# Patient Record
Sex: Male | Born: 1980 | Race: White | Hispanic: No | Marital: Single
Health system: Southern US, Community
[De-identification: ages and names within clinical notes are randomized; demographics above are authoritative.]

---

## 2007-09-21 ENCOUNTER — Emergency Department (HOSPITAL_COMMUNITY): Admission: EM | Admit: 2007-09-21 | Discharge: 2007-09-22 | Payer: Self-pay | Admitting: Emergency Medicine

## 2009-03-29 ENCOUNTER — Ambulatory Visit: Payer: Self-pay | Admitting: Diagnostic Radiology

## 2009-03-29 ENCOUNTER — Emergency Department (HOSPITAL_BASED_OUTPATIENT_CLINIC_OR_DEPARTMENT_OTHER): Admission: EM | Admit: 2009-03-29 | Discharge: 2009-03-29 | Payer: Self-pay | Admitting: Emergency Medicine

## 2009-06-07 ENCOUNTER — Emergency Department (HOSPITAL_COMMUNITY): Admission: EM | Admit: 2009-06-07 | Discharge: 2009-06-07 | Payer: Self-pay | Admitting: Emergency Medicine

## 2010-02-05 ENCOUNTER — Emergency Department (HOSPITAL_COMMUNITY): Admission: EM | Admit: 2010-02-05 | Discharge: 2010-02-05 | Payer: Self-pay | Admitting: Emergency Medicine

## 2021-08-03 ENCOUNTER — Encounter (HOSPITAL_BASED_OUTPATIENT_CLINIC_OR_DEPARTMENT_OTHER): Payer: Medicaid Other | Attending: Internal Medicine | Admitting: Internal Medicine

## 2021-08-03 ENCOUNTER — Other Ambulatory Visit: Payer: Self-pay

## 2021-08-03 DIAGNOSIS — I10 Essential (primary) hypertension: Secondary | ICD-10-CM | POA: Insufficient documentation

## 2021-08-03 DIAGNOSIS — L97512 Non-pressure chronic ulcer of other part of right foot with fat layer exposed: Secondary | ICD-10-CM | POA: Insufficient documentation

## 2021-08-03 DIAGNOSIS — F1910 Other psychoactive substance abuse, uncomplicated: Secondary | ICD-10-CM | POA: Insufficient documentation

## 2021-08-03 DIAGNOSIS — L03115 Cellulitis of right lower limb: Secondary | ICD-10-CM | POA: Insufficient documentation
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
## 2021-08-03 NOTE — Progress Notes (Signed)
BEE, HAMMERSCHMIDT ([id number]) Visit Report for 08/03/2021 Abuse/Suicide Risk Screen Details Patient Name: Date of Service: [name], [name] 08/03/2021 7:30 A M Medical Record Number: [id number] Patient Account Number: [id number] Date of Birth/Sex: Treating RN: 19-Sep-1981 (40 y.o. Lytle Michaels Primary Care Natale Barba: Other Clinician: Referring Emila Steinhauser: Treating Jaena Brocato/Extender: Tilda Franco in Treatment: 0 Abuse/Suicide Risk Screen Items Answer ABUSE RISK SCREEN: Has anyone close to you tried to hurt or harm you recentlyo No Do you feel uncomfortable with anyone in your familyo No Has anyone forced you do things that you didnt want to doo No Electronic Signature(s) Signed: 08/03/2021 4:35:28 PM By: Antonieta Iba Entered By: Antonieta Iba on 08/03/2021 08:03:31 -------------------------------------------------------------------------------- Activities of Daily Living Details Patient Name: Date of Service: [name], [name] 08/03/2021 7:30 A M Medical Record Number: [id number] Patient Account Number: [id number] Date of Birth/Sex: Treating RN: 1981-03-31 (40 y.o. Lytle Michaels Primary Care Krysta Bloomfield: Other Clinician: Referring Aleesia Henney: Treating Cletis Muma/Extender: Tilda Franco in Treatment: 0 Activities of Daily Living Items Answer Activities of Daily Living (Please select one for each item) Drive Automobile Completely Able T Medications ake Completely Able Use T elephone Completely Able Care for Appearance Completely Able Use T oilet Completely Able Bath / Shower Completely Able Dress Self Completely Able Feed Self Completely Able Walk Completely Able Get In / Out Bed Completely Able Housework Completely Able Prepare Meals Completely Able Handle Money Completely Able Shop for Self Completely Able Electronic Signature(s) Signed: 08/03/2021 4:35:28 PM By: Antonieta Iba Entered By: Antonieta Iba on 08/03/2021  07:53:24 -------------------------------------------------------------------------------- Education Screening Details Patient Name: Date of Service: [name] 08/03/2021 7:30 A M Medical Record Number: [id number] Patient Account Number: [id number] Date of Birth/Sex: Treating RN: 07-21-1981 (40 y.o. Lytle Michaels Primary Care Aristeo Hankerson: Other Clinician: Referring Nakyra Bourn: Treating Alania Overholt/Extender: Tilda Franco in Treatment: 0 Primary Learner Assessed: Patient Learning Preferences/Education Level/Primary Language Learning Preference: Explanation, Demonstration, Printed Material Highest Education Level: High School Preferred Language: English Cognitive Barrier Language Barrier: No Translator Needed: No Memory Deficit: No Emotional Barrier: No Cultural/Religious Beliefs Affecting Medical Care: No Physical Barrier Impaired Vision: Yes Glasses Impaired Hearing: No Decreased Hand dexterity: No Knowledge/Comprehension Knowledge Level: Medium Comprehension Level: High Ability to understand written instructions: High Ability to understand verbal instructions: High Motivation Anxiety Level: Calm Cooperation: Cooperative Education Importance: Acknowledges Need Interest in Health Problems: Asks Questions Perception: Coherent Willingness to Engage in Self-Management High Activities: Readiness to Engage in Self-Management High Activities: Electronic Signature(s) Signed: 08/03/2021 4:35:28 PM By: Antonieta Iba Entered By: Antonieta Iba on 08/03/2021 08:04:04 -------------------------------------------------------------------------------- Fall Risk Assessment Details Patient Name: Date of Service: [name] 08/03/2021 7:30 A M Medical Record Number: [id number] Patient Account Number: [id number] Date of Birth/Sex: Treating RN: October 31, 1980 (40 y.o. Lytle Michaels Primary Care Ayeden Gladman: Other Clinician: Referring Saben Donigan: Treating  Matilyn Fehrman/Extender: Tilda Franco in Treatment: 0 Fall Risk Assessment Items Have you had 2 or more falls in the last 12 monthso 0 No Have you had any fall that resulted in injury in the last 12 monthso 0 No FALLS RISK SCREEN History of falling - immediate or within 3 months 0 No Secondary diagnosis (Do you have 2 or more medical diagnoseso) 0 No Ambulatory aid None/bed rest/wheelchair/nurse 0 Yes Crutches/cane/walker 0 No Furniture 0 No Intravenous therapy Access/Saline/Heparin Lock 0 No Gait/Transferring Normal/ bed rest/ wheelchair 0 Yes Weak (short steps with or without shuffle, stooped but able to lift head while walking, may seek 0 No support from furniture) Impaired (short steps  with shuffle, may have difficulty arising from chair, head down, impaired 0 No balance) Mental Status Oriented to own ability 0 Yes Electronic Signature(s) Signed: 08/03/2021 4:35:28 PM By: Antonieta Iba Entered By: Antonieta Iba on 08/03/2021 07:53:41 -------------------------------------------------------------------------------- Foot Assessment Details Patient Name: Date of Service: [name] 08/03/2021 7:30 A M Medical Record Number: [id number] Patient Account Number: [id number] Date of Birth/Sex: Treating RN: 1981/03/29 (40 y.o. Lytle Michaels Primary Care Amerigo Mcglory: Other Clinician: Referring Lucrezia Dehne: Treating Kandas Oliveto/Extender: Tilda Franco in Treatment: 0 Foot Assessment Items Site Locations + = Sensation present, - = Sensation absent, C = Callus, U = Ulcer R = Redness, W = Warmth, M = Maceration, PU = Pre-ulcerative lesion F = Fissure, S = Swelling, D = Dryness Assessment Right: Left: Other Deformity: No No Prior Foot Ulcer: No No Prior Amputation: No No Charcot Joint: No No Ambulatory Status: Ambulatory Without Help Gait: Steady Electronic Signature(s) Signed: 08/03/2021 4:35:28 PM By: Antonieta Iba Entered By: Antonieta Iba on 08/03/2021  08:07:06 -------------------------------------------------------------------------------- Nutrition Risk Screening Details Patient Name: Date of Service: [name], [name] 08/03/2021 7:30 A M Medical Record Number: [id number] Patient Account Number: [id number] Date of Birth/Sex: Treating RN: May 14, 1981 (40 y.o. Lytle Michaels Primary Care Koven Belinsky: Other Clinician: Referring Soleil Mas: Treating Janye Maynor/Extender: Tilda Franco in Treatment: 0 Height (in): Weight (lbs): Body Mass Index (BMI): Nutrition Risk Screening Items Score Screening NUTRITION RISK SCREEN: I have an illness or condition that made me change the kind and/or amount of food I eat 0 No I eat fewer than two meals per day 0 No I eat few fruits and vegetables, or milk products 0 No I have three or more drinks of beer, liquor or wine almost every day 0 No I have tooth or mouth problems that make it hard for me to eat 0 No I don't always have enough money to buy the food I need 0 No I eat alone most of the time 0 No I take three or more different prescribed or over-the-counter drugs a day 0 No Without wanting to, I have lost or gained 10 pounds in the last six months 0 No I am not always physically able to shop, cook and/or feed myself 0 No Nutrition Protocols Good Risk Protocol 0 No interventions needed Moderate Risk Protocol High Risk Proctocol Risk Level: Good Risk Score: 0 Electronic Signature(s) Signed: 08/03/2021 4:35:28 PM By: Antonieta Iba Entered By: Antonieta Iba on 08/03/2021 07:53:49
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
## 2021-08-10 ENCOUNTER — Other Ambulatory Visit: Payer: Self-pay

## 2021-08-10 ENCOUNTER — Encounter (HOSPITAL_BASED_OUTPATIENT_CLINIC_OR_DEPARTMENT_OTHER): Payer: Medicaid Other | Admitting: Internal Medicine

## 2021-08-10 DIAGNOSIS — L97512 Non-pressure chronic ulcer of other part of right foot with fat layer exposed: Secondary | ICD-10-CM

## 2021-08-10 DIAGNOSIS — F1910 Other psychoactive substance abuse, uncomplicated: Secondary | ICD-10-CM

## 2021-08-10 DIAGNOSIS — L03115 Cellulitis of right lower limb: Secondary | ICD-10-CM
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
## 2021-08-17 ENCOUNTER — Other Ambulatory Visit: Payer: Self-pay

## 2021-08-17 ENCOUNTER — Encounter (HOSPITAL_BASED_OUTPATIENT_CLINIC_OR_DEPARTMENT_OTHER): Payer: Medicaid Other | Admitting: Internal Medicine

## 2021-08-17 DIAGNOSIS — L97512 Non-pressure chronic ulcer of other part of right foot with fat layer exposed: Secondary | ICD-10-CM

## 2021-08-17 NOTE — Progress Notes (Signed)
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, uncomplicated Facility Procedures CPT4 Code: [id number] Description: 11042 - DEB SUBQ TISSUE 20 SQ CM/< ICD-10 Diagnosis Description L97.512 Non-pressure chronic ulcer of other  part of right foot with fat layer exposed Modifier: Quantity: 1 Physician Procedures : CPT4 Code Description Modifier [id number] 11042 - WC PHYS SUBQ TISS 20 SQ CM ICD-10 Diagnosis Description  L97.512 Non-pressure chronic ulcer of other part of right foot with fat layer exposed Quantity: 1 Electronic Signature(s) Signed: 08/17/2021 1:24:03 PM By: Geralyn Corwin DO Signed: 08/17/2021 6:26:42 PM By: Shawn Stall RN, BSN Entered By: Shawn Stall on 08/17/2021 08:45:59
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
## 2021-08-19 ENCOUNTER — Other Ambulatory Visit: Payer: Self-pay | Admitting: Internal Medicine

## 2021-08-19 DIAGNOSIS — L539 Erythematous condition, unspecified: Secondary | ICD-10-CM

## 2021-08-19 DIAGNOSIS — R609 Edema, unspecified: Secondary | ICD-10-CM

## 2021-08-20 LAB — AEROBIC CULTURE W GRAM STAIN (SUPERFICIAL SPECIMEN): Gram Stain: NONE SEEN

## 2021-08-23 LAB — ANAEROBIC CULTURE W GRAM STAIN: Gram Stain: NONE SEEN

## 2021-08-24 ENCOUNTER — Encounter (HOSPITAL_BASED_OUTPATIENT_CLINIC_OR_DEPARTMENT_OTHER): Payer: Self-pay | Attending: Internal Medicine | Admitting: Internal Medicine

## 2021-08-24 ENCOUNTER — Other Ambulatory Visit: Payer: Self-pay

## 2021-08-24 ENCOUNTER — Ambulatory Visit
Admission: RE | Admit: 2021-08-24 | Discharge: 2021-08-24 | Disposition: A | Discharge status: Home / Self Care | Payer: Worker's Compensation | Source: Ambulatory Visit | Attending: Internal Medicine | Admitting: Internal Medicine

## 2021-08-24 DIAGNOSIS — L03115 Cellulitis of right lower limb: Secondary | ICD-10-CM | POA: Insufficient documentation

## 2021-08-24 DIAGNOSIS — L539 Erythematous condition, unspecified: Secondary | ICD-10-CM

## 2021-08-24 DIAGNOSIS — Z833 Family history of diabetes mellitus: Secondary | ICD-10-CM | POA: Insufficient documentation

## 2021-08-24 DIAGNOSIS — R609 Edema, unspecified: Secondary | ICD-10-CM

## 2021-08-24 DIAGNOSIS — L97512 Non-pressure chronic ulcer of other part of right foot with fat layer exposed: Secondary | ICD-10-CM | POA: Insufficient documentation

## 2021-08-25 NOTE — Progress Notes (Signed)
OSEAS, DETTY ([id number]) Visit Report for 08/24/2021 SuperBill Details Patient Name: Date of Service: MACARI, ZALESKY 08/24/2021 Medical Record Number: [id number] Patient Account Number: [id number] Date of Birth/Sex: Treating RN: 03/27/1981 (40 y.o. 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

## 2021-08-25 NOTE — Progress Notes (Signed)
EDRIK, RUNDLE ([id number]) Visit Report for 08/24/2021 Arrival Information Details Patient Name: Date of Service: [name], [name] 08/24/2021 8:00 A M Medical Record Number: [id number] Patient Account Number: [id number] Date of Birth/Sex: Treating RN: [birth year] (40 y.o. Tammy Sours Primary Care Miriana Gaertner: Other Clinician: Referring Anikka Marsan: Treating Madina Galati/Extender: Tilda Franco in Treatment: 3 Visit Information History Since Last Visit All ordered tests and consults were completed: Yes Patient Arrived: Ambulatory Added or deleted any medications: No Arrival Time: 08:09 Any new allergies or adverse reactions: No Accompanied By: self Had a fall or experienced change in No Transfer Assistance: None activities of daily living that may affect Patient Identification Verified: Yes risk of falls: Secondary Verification Process Completed: Yes Signs or symptoms of abuse/neglect since last visito No Patient Requires Transmission-Based Precautions: No Hospitalized since last visit: No Patient Has Alerts: No Implantable device outside of the clinic excluding No cellular tissue based products placed in the center since last visit: Has Dressing in Place as Prescribed: Yes Pain Present Now: Yes Notes Per patient ultrasound scheduled for later today. Explained the ultrasound results should be in tomorrow for [name] to evaluate. Patient in agreement. Electronic Signature(s) Signed: 08/24/2021 2:38:54 PM By: Shawn Stall RN, BSN Entered By: Shawn Stall on 08/24/2021 08:16:58 -------------------------------------------------------------------------------- Clinic Level of Care Assessment Details Patient Name: Date of Service: [name], [name] 08/24/2021 8:00 A M Medical Record Number: [id number] Patient Account Number: [id number] Date of Birth/Sex: Treating RN: May 31, 1981 (40 y.o. Tammy Sours Primary Care Oral Remache: Other Clinician: Referring Elleen Coulibaly: Treating  Yarexi Pawlicki/Extender: Tilda Franco in Treatment: 3 Clinic Level of Care Assessment Items TOOL 4 Quantity Score X- 1 0 Use when only an EandM is performed on FOLLOW-UP visit ASSESSMENTS - Nursing Assessment / Reassessment X- 1 10 Reassessment of Co-morbidities (includes updates in patient status) X- 1 5 Reassessment of Adherence to Treatment Plan ASSESSMENTS - Wound and Skin A ssessment / Reassessment []  - 0 Simple Wound Assessment / Reassessment - one wound X- 2 5 Complex Wound Assessment / Reassessment - multiple wounds X- 1 10 Dermatologic / Skin Assessment (not related to wound area) ASSESSMENTS - Focused Assessment []  - 0 Circumferential Edema Measurements - multi extremities []  - 0 Nutritional Assessment / Counseling / Intervention []  - 0 Lower Extremity Assessment (monofilament, tuning fork, pulses) []  - 0 Peripheral Arterial Disease Assessment (using hand held doppler) ASSESSMENTS - Ostomy and/or Continence Assessment and Care []  - 0 Incontinence Assessment and Management []  - 0 Ostomy Care Assessment and Management (repouching, etc.) PROCESS - Coordination of Care []  - 0 Simple Patient / Family Education for ongoing care X- 1 20 Complex (extensive) Patient / Family Education for ongoing care X- 1 10 Staff obtains , Records, T Results / Process Orders est []  - 0 Staff telephones HHA, Nursing Homes / Clarify orders / etc []  - 0 Routine Transfer to another Facility (non-emergent condition) []  - 0 Routine Hospital Admission (non-emergent condition) []  - 0 New Admissions / / Ordering NPWT Apligraf, etc. , []  - 0 Emergency Hospital Admission (emergent condition) []  - 0 Simple Discharge Coordination X- 1 15 Complex (extensive) Discharge Coordination PROCESS - Special Needs []  - 0 Pediatric / Minor Patient Management []  - 0 Isolation Patient Management []  - 0 Hearing / Language / Visual special needs []  -  0 Assessment of Community assistance (transportation, D/C planning, etc.) []  - 0 Additional assistance / Altered mentation []  - 0 Support Surface(s) Assessment (bed, cushion, seat, etc.) INTERVENTIONS - Wound Cleansing /  Measurement []  - 0 Simple Wound Cleansing - one wound X- 2 5 Complex Wound Cleansing - multiple wounds X- 1 5 Wound Imaging (photographs - any number of wounds) []  - 0 Wound Tracing (instead of photographs) []  - 0 Simple Wound Measurement - one wound X- 2 5 Complex Wound Measurement - multiple wounds INTERVENTIONS - Wound Dressings X - Small Wound Dressing one or multiple wounds 2 10 []  - 0 Medium Wound Dressing one or multiple wounds []  - 0 Large Wound Dressing one or multiple wounds []  - 0 Application of Medications - topical []  - 0 Application of Medications - injection INTERVENTIONS - Miscellaneous []  - 0 External ear exam []  - 0 Specimen Collection (cultures, biopsies, blood, body fluids, etc.) []  - 0 Specimen(s) / Culture(s) sent or taken to Lab for analysis []  - 0 Patient Transfer (multiple staff / / Similar devices) []  - 0 Simple Staple / Suture removal (25 or less) []  - 0 Complex Staple / Suture removal (26 or more) []  - 0 Hypo / Hyperglycemic Management (close monitor of Blood Glucose) []  - 0 Ankle / Brachial Index (ABI) - do not check if billed separately X- 1 5 Vital Signs Has the patient been seen at the hospital within the last three years: Yes Total Score: 130 Level Of Care: New/Established - Level 4 Electronic Signature(s) Signed: 08/24/2021 2:38:54 PM By: RN, BSN Entered By: on 08/24/2021 08:17:54 -------------------------------------------------------------------------------- Encounter Discharge Information Details Patient Name: Date of Service: 08/24/2021 8:00 A M Medical Record Number: Patient Account Number: Date of Birth/Sex: Treating  RN: 06/04/1981 (40 y.o. Nurse, adult Primary Care Olvin Rohr: Other Clinician: Referring Shadonna Benedick: Treating Meiko Ives/Extender: in Treatment: 3 Encounter Discharge Information Items Discharge Condition: Stable Ambulatory Status: Ambulatory Discharge Destination: Home Transportation: Private Auto Accompanied By: self Schedule Follow-up Appointment: Yes Clinical Summary of Care: Patient Declined Electronic Signature(s) Signed: 08/25/2021 5:02:41 PM By: RN Entered By: on 08/24/2021 08:27:22 -------------------------------------------------------------------------------- Patient/Caregiver Education Details Patient Name: Date of Service: [name], [name] 12/5/2022andnbsp8:00 A M Medical Record Number: Shawn Stall Patient Account Number: 14/01/2021 Date of Birth/Gender: Treating RN: December 01, 1980 (40 y.o. [id number] Primary Care Physician: Other Clinician: Referring Physician: Treating Physician/Extender: [id number] in Treatment: 3 Education Assessment Education Provided To: Patient Education Topics Provided Pain: Handouts: A Guide to Pain Control Methods: Explain/Verbal Responses: Reinforcements needed Electronic Signature(s) Signed: 08/25/2021 5:02:41 PM By: 24 RN Entered By: Lytle Michaels on 08/24/2021 08:26:42 -------------------------------------------------------------------------------- Wound Assessment Details Patient Name: Date of Service: 14/02/2021 08/24/2021 8:00 A M Medical Record Number: Karie Schwalbe Patient Account Number: 14/01/2021 Date of Birth/Sex: Treating RN: 1981/04/30 (40 y.o. [id number] Primary Care Gahel Safley: Other Clinician: Referring Jayanna Kroeger: Treating Damaris Abeln/Extender: 02/13/1981 in Treatment: 3 Wound Status Wound Number: 1 Primary Etiology: Infection - not elsewhere classified Wound Location: Right T Great oe Wound Status:  Open Wounding Event: Thermal Burn Date Acquired: 01/05/2021 Weeks Of Treatment: 3 Clustered Wound: No Wound Measurements Length: (cm) 1.5 Width: (cm) 1.5 Depth: (cm) 0.1 Area: (cm) 1.767 Volume: (cm) 0.177 % Reduction in Area: -114.2% % Reduction in Volume: -115.9% Wound Description Classification: Full Thickness Without Exposed Support Structures Exudate Amount: Medium Treatment Notes Wound #1 (Toe Great) Wound Laterality: Right Cleanser Soap and Water Discharge Instruction: May shower and wash wound with dial antibacterial soap and water prior to dressing change. Peri-Wound Care Topical Primary Dressing Gentamicin Ointment Discharge Instruction: apply to  wound bed daily. Secondary Dressing Woven Gauze Sponge, Non-Sterile 4x4 in Discharge Instruction: Apply over primary dressing as directed. Secured With Conforming Stretch Gauze Bandage, Sterile 2x75 (in/in) Discharge Instruction: Secure with stretch gauze as directed. Compression Wrap Compression Stockings Add-Ons Electronic Signature(s) Signed: 08/24/2021 2:38:54 PM By: Shawn Stall RN, BSN Entered By: Shawn Stall on 08/24/2021 08:15:00 -------------------------------------------------------------------------------- Wound Assessment Details Patient Name: Date of Service: [name] 08/24/2021 8:00 A M Medical Record Number: [id number] Patient Account Number: [id number] Date of Birth/Sex: Treating RN: 22-Sep-1980 (40 y.o. Tammy Sours Primary Care Faylynn Stamos: Other Clinician: Referring Berenise Hunton: Treating Stryder Poitra/Extender: Tilda Franco in Treatment: 3 Wound Status Wound Number: 2 Primary Etiology: Infection - not elsewhere classified Wound Location: Right T Second oe Wound Status: Open Wounding Event: Thermal Burn Date Acquired: 01/05/2021 Weeks Of Treatment: 3 Clustered Wound: No Wound Measurements Length: (cm) 2 Width: (cm) 1.8 Depth: (cm) 0.1 Area: (cm) 2.827 Volume: (cm)  0.283 % Reduction in Area: -60% % Reduction in Volume: -59.9% Wound Description Classification: Full Thickness Without Exposed Support Structu Exudate Amount: Medium Exudate Type: Serosanguineous Exudate Color: red, brown res Treatment Notes Wound #2 (Toe Second) Wound Laterality: Right Cleanser Soap and Water Discharge Instruction: May shower and wash wound with dial antibacterial soap and water prior to dressing change. Peri-Wound Care Topical Primary Dressing Gentamicin Ointment Discharge Instruction: apply to wound bed daily. Secondary Dressing Woven Gauze Sponge, Non-Sterile 4x4 in Discharge Instruction: Apply over primary dressing as directed. Secured With Conforming Stretch Gauze Bandage, Sterile 2x75 (in/in) Discharge Instruction: Secure with stretch gauze as directed. Compression Wrap Compression Stockings Add-Ons Electronic Signature(s) Signed: 08/24/2021 2:38:54 PM By: Shawn Stall RN, BSN Entered By: Shawn Stall on 08/24/2021 08:15:00 -------------------------------------------------------------------------------- Vitals Details Patient Name: Date of Service: [name] 08/24/2021 8:00 A M Medical Record Number: [id number] Patient Account Number: [id number] Date of Birth/Sex: Treating RN: [birth year] (40 y.o. Tammy Sours Primary Care Lousie Calico: Other Clinician: Referring Andrika Peraza: Treating Tecora Eustache/Extender: Tilda Franco in Treatment: 3 Vital Signs Time Taken: 08:10 Temperature (F): 98.3 Height (in): 71 Pulse (bpm): 108 Weight (lbs): 196 Respiratory Rate (breaths/min): 20 Body Mass Index (BMI): 27.3 Blood Pressure (mmHg): 145/95 Reference Range: 80 - 120 mg / dl Electronic Signature(s) Signed: 08/24/2021 2:38:54 PM By: Shawn Stall RN, BSN Entered By: Shawn Stall on 08/24/2021 08:10:28

## 2021-08-31 ENCOUNTER — Encounter (HOSPITAL_BASED_OUTPATIENT_CLINIC_OR_DEPARTMENT_OTHER): Payer: Medicaid Other | Admitting: Internal Medicine

## 2021-08-31 ENCOUNTER — Other Ambulatory Visit: Payer: Self-pay

## 2021-08-31 DIAGNOSIS — L03115 Cellulitis of right lower limb: Secondary | ICD-10-CM

## 2021-08-31 DIAGNOSIS — F1910 Other psychoactive substance abuse, uncomplicated: Secondary | ICD-10-CM

## 2021-08-31 DIAGNOSIS — L97512 Non-pressure chronic ulcer of other part of right foot with fat layer exposed: Secondary | ICD-10-CM
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
## 2021-09-07 ENCOUNTER — Encounter (HOSPITAL_BASED_OUTPATIENT_CLINIC_OR_DEPARTMENT_OTHER): Payer: Self-pay | Admitting: Internal Medicine

## 2021-09-07 ENCOUNTER — Other Ambulatory Visit: Payer: Self-pay

## 2021-09-07 DIAGNOSIS — L97512 Non-pressure chronic ulcer of other part of right foot with fat layer exposed: Secondary | ICD-10-CM
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
## 2021-09-17 ENCOUNTER — Other Ambulatory Visit: Payer: Self-pay

## 2021-09-17 ENCOUNTER — Encounter (HOSPITAL_BASED_OUTPATIENT_CLINIC_OR_DEPARTMENT_OTHER): Payer: Medicaid Other | Admitting: Internal Medicine

## 2021-09-17 DIAGNOSIS — L97512 Non-pressure chronic ulcer of other part of right foot with fat layer exposed: Secondary | ICD-10-CM
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
## 2021-09-24 ENCOUNTER — Encounter (HOSPITAL_BASED_OUTPATIENT_CLINIC_OR_DEPARTMENT_OTHER): Payer: Self-pay | Attending: Internal Medicine | Admitting: Internal Medicine

## 2021-09-24 ENCOUNTER — Other Ambulatory Visit: Payer: Self-pay

## 2021-09-24 DIAGNOSIS — L97512 Non-pressure chronic ulcer of other part of right foot with fat layer exposed: Secondary | ICD-10-CM | POA: Insufficient documentation

## 2021-09-24 DIAGNOSIS — L03115 Cellulitis of right lower limb: Secondary | ICD-10-CM | POA: Insufficient documentation
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
## 2021-10-01 ENCOUNTER — Encounter (HOSPITAL_BASED_OUTPATIENT_CLINIC_OR_DEPARTMENT_OTHER): Payer: Self-pay | Admitting: Internal Medicine

## 2021-10-02 ENCOUNTER — Encounter (HOSPITAL_BASED_OUTPATIENT_CLINIC_OR_DEPARTMENT_OTHER): Payer: Self-pay | Admitting: Internal Medicine

## 2021-10-02 ENCOUNTER — Other Ambulatory Visit: Payer: Self-pay

## 2021-10-02 DIAGNOSIS — L97512 Non-pressure chronic ulcer of other part of right foot with fat layer exposed: Secondary | ICD-10-CM
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
## 2021-10-09 ENCOUNTER — Encounter (HOSPITAL_BASED_OUTPATIENT_CLINIC_OR_DEPARTMENT_OTHER): Payer: Medicaid Other | Admitting: Internal Medicine

## 2021-10-09 ENCOUNTER — Other Ambulatory Visit: Payer: Self-pay

## 2021-10-09 DIAGNOSIS — L97512 Non-pressure chronic ulcer of other part of right foot with fat layer exposed: Secondary | ICD-10-CM
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
## 2021-10-16 ENCOUNTER — Other Ambulatory Visit: Payer: Self-pay

## 2021-10-16 ENCOUNTER — Encounter (HOSPITAL_BASED_OUTPATIENT_CLINIC_OR_DEPARTMENT_OTHER): Payer: Medicaid Other | Admitting: Internal Medicine

## 2021-10-16 DIAGNOSIS — L03115 Cellulitis of right lower limb: Secondary | ICD-10-CM

## 2021-10-16 DIAGNOSIS — L97512 Non-pressure chronic ulcer of other part of right foot with fat layer exposed: Secondary | ICD-10-CM

## 2021-10-16 DIAGNOSIS — F1910 Other psychoactive substance abuse, uncomplicated: Secondary | ICD-10-CM
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
## 2021-12-18 ENCOUNTER — Ambulatory Visit (HOSPITAL_BASED_OUTPATIENT_CLINIC_OR_DEPARTMENT_OTHER): Payer: Medicaid Other | Admitting: Internal Medicine

## 2022-06-02 IMAGING — US US EXTREM LOW*R* LIMITED
1 series · 14 of 22 positions shown · non-contrast
Comparison: None.

CLINICAL DATA: Right foot edema and redness.  Evaluate for abscess.

EXAM:
ULTRASOUND right LOWER EXTREMITY LIMITED
TECHNIQUE: Ultrasound examination of the lower extremity soft tissues was
performed in the area of clinical concern.

[Series 1: us extrem low*right* limited · 0.04mm/px · 22 acquisitions, 14 frames shown]
[im 1/22]
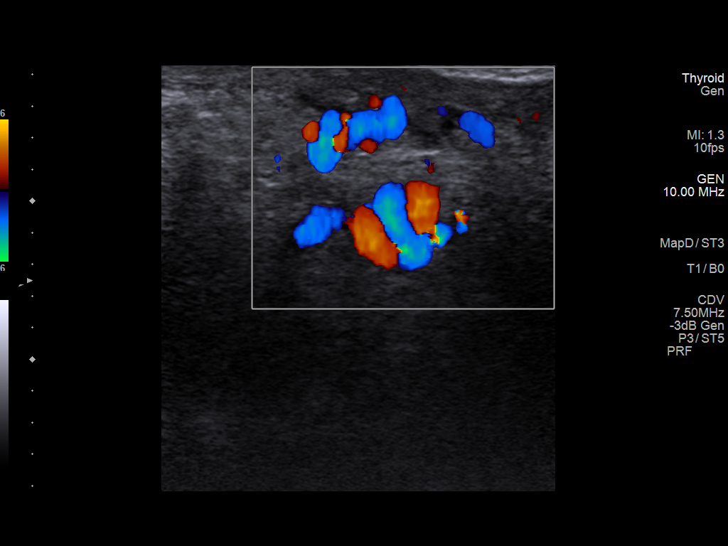
[im 3/22]
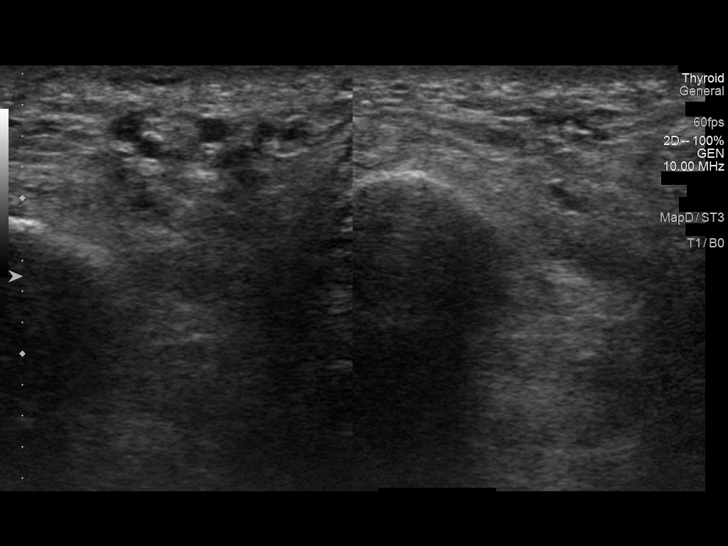
[im 4/22]
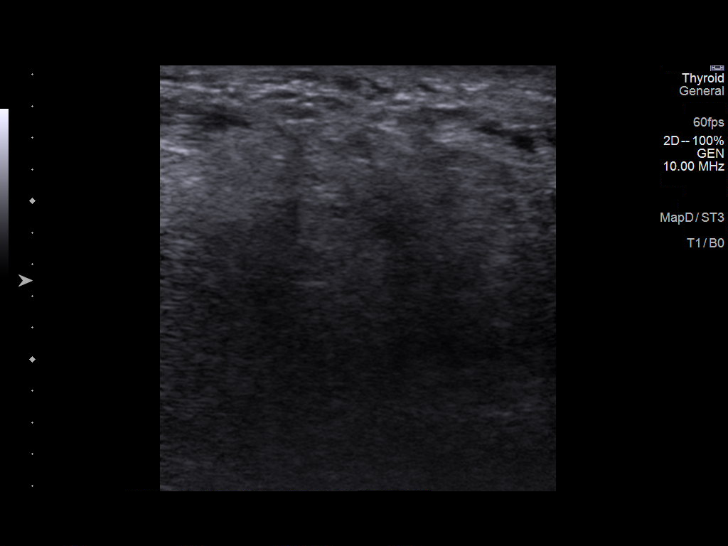
[im 6/22]
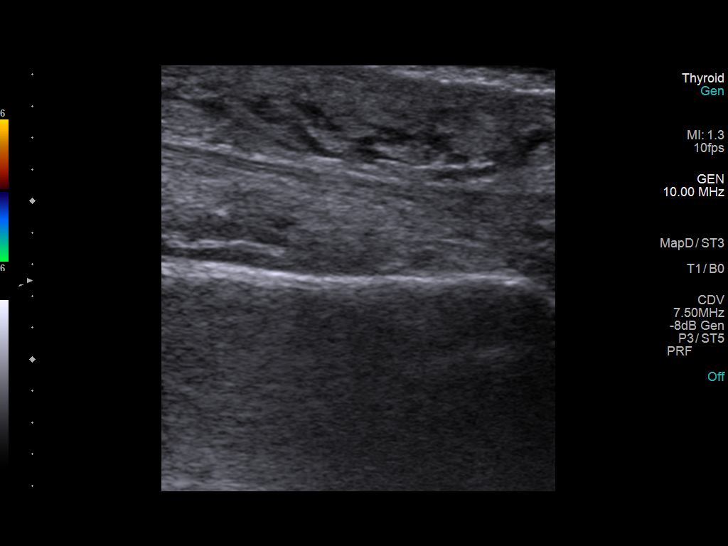
[im 8/22]
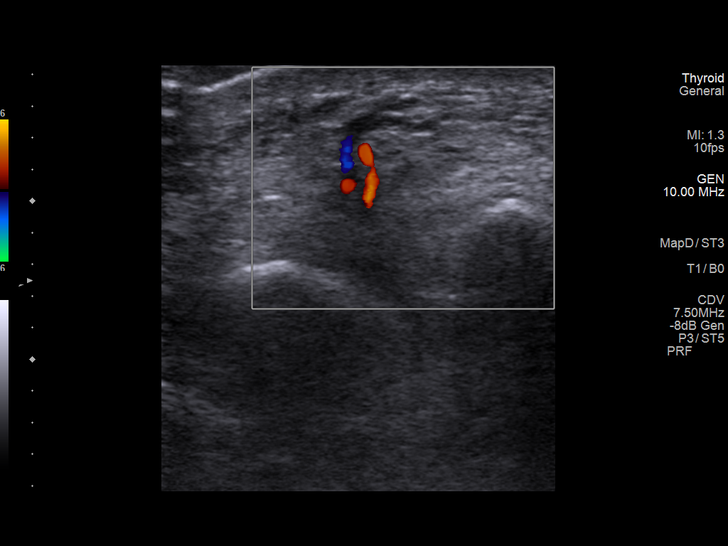
[im 9/22]
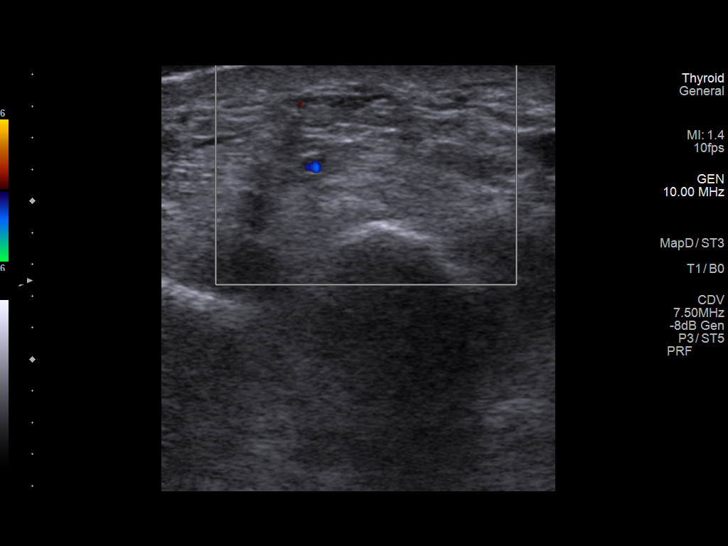
[im 11/22]
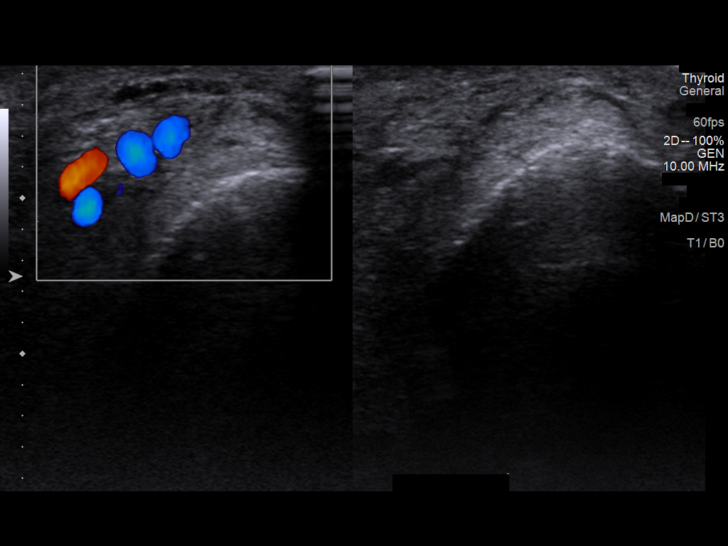
[im 12/22]
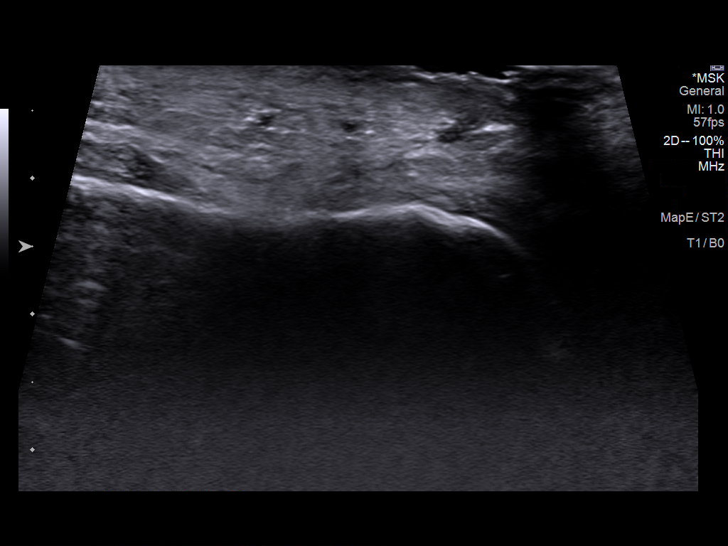
[im 14/22]
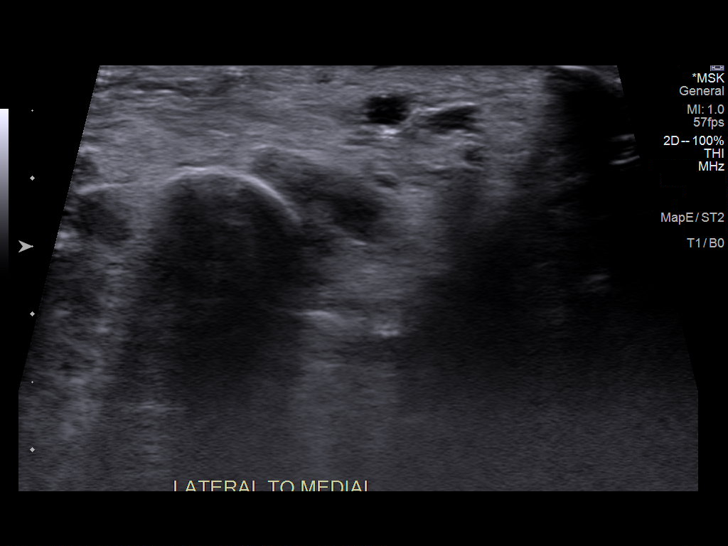
[im 15/22]
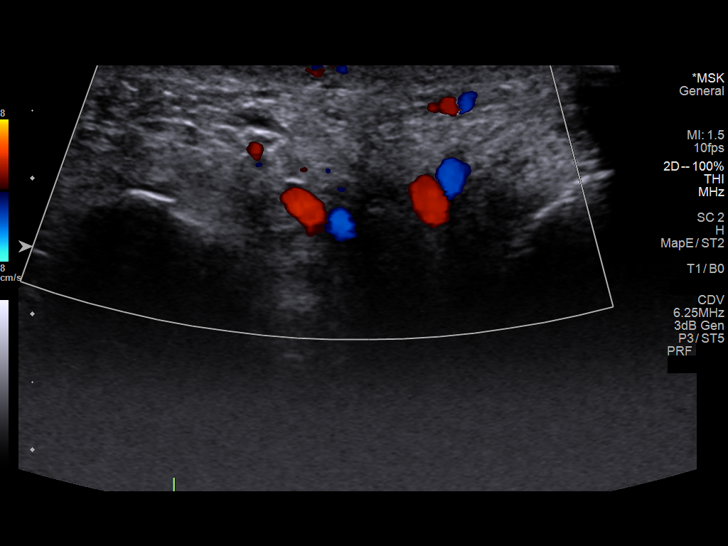
[im 17/22]
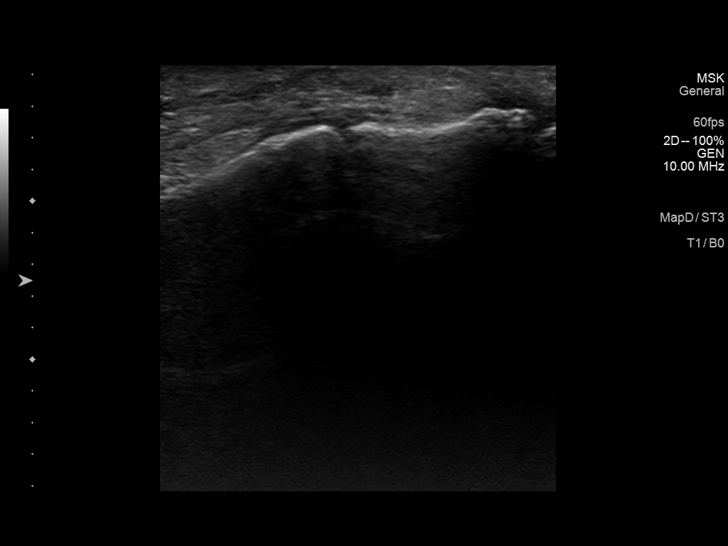
[im 19/22]
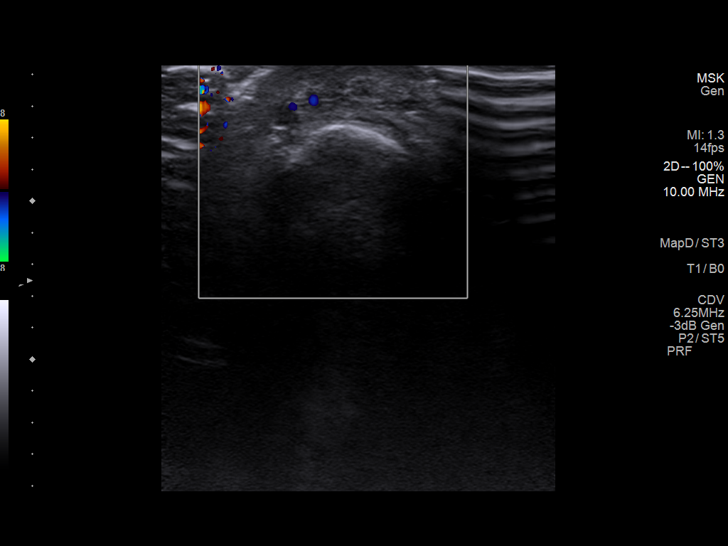
[im 20/22]
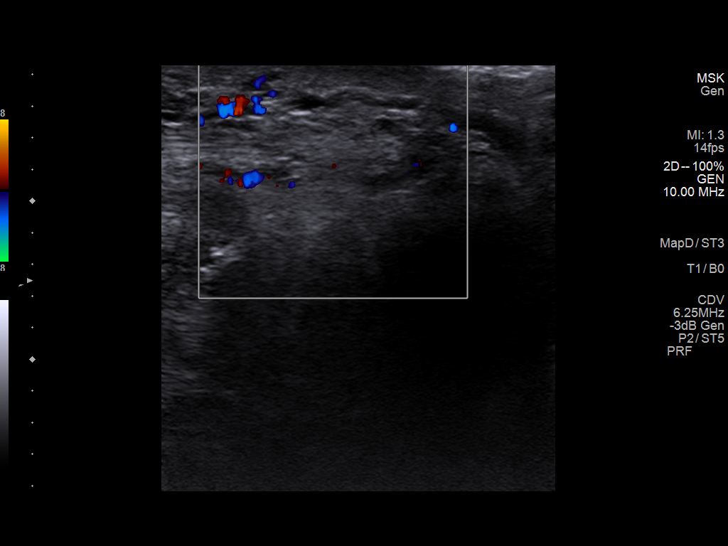
[im 22/22]
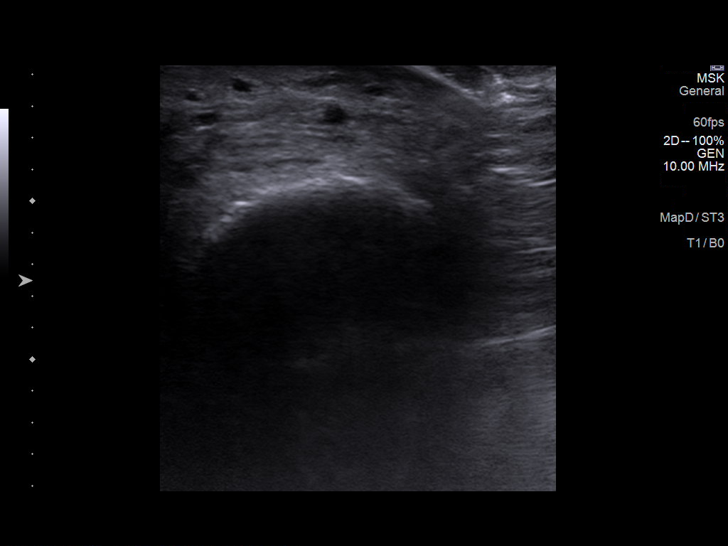

[14 of 22 positions shown; findings below may reference images not displayed]

FINDINGS: There is diffuse subcutaneous edema and hyperemia of the dorsum of
the forefoot which may represent cellulitis. No drainable fluid
collection or abscess identified.
IMPRESSION: Edema and hyperemia.  No abscess.

## 2023-05-31 ENCOUNTER — Ambulatory Visit: Payer: Managed Care, Other (non HMO) | Admitting: Podiatry

## 2023-05-31 DIAGNOSIS — L97512 Non-pressure chronic ulcer of other part of right foot with fat layer exposed: Secondary | ICD-10-CM

## 2023-05-31 DIAGNOSIS — L97522 Non-pressure chronic ulcer of other part of left foot with fat layer exposed: Secondary | ICD-10-CM

## 2023-05-31 NOTE — Progress Notes (Unsigned)
Subjective:  Patient ID: [name], male    DOB: 1980-12-20,  MRN: [id number]  Chief Complaint  Patient presents with   Wound Check    open wound on right and left foot;possible cellulitis of right foot    42 y.o. male presents with concern for wounds on bilateral foot small wound on the right dorsal forefoot near the second toe base as well as a larger 1 on the left lateral midfoot dorsally.  Patient states he has had the wounds on and off for quite a while.  Does not have a history of diabetes he is unsure why he has wounds developed.  He does have a smoking history smokes about a pack per day.  Denies alcohol use.  Does have some neuropathy and numbness in his toes.  Patient was previously on doxycycline but has finished all antibiotics at this time says that improved after taking doxycycline.  He has been applying mupirocin ointment to the wounds.  And thinks they may be slightly improving.  No past medical history on file.  No Known Allergies  ROS: Negative except as per HPI above  Objective:  General: AAO x3, NAD  Dermatological: Ulcer present at the base of the second toe on the right foot dorsal aspect near the MPJ probes approximately 2 mm deep and is about 2 x 2 mm wide.  Very small ulceration with fibrotic tissue at the base.  Appears to be improving from prior  On the lateral left midfoot dorsally there is no to be an ulceration approximate 1.5 x 1 x 0.2 cm with firm eschar/fibrotic tissue present overlying.  No significant erythema surrounding also appears to be healing with healthy skin surrounding.  Very painful to touch  Vascular:  Dorsalis Pedis artery and Posterior Tibial artery pedal pulses are 2/4 bilateral.  Capillary fill time < 3 sec to all digits.   Neruologic: Grossly diminished via light touch to the toes.  However protective sensation is intact  Musculoskeletal: No gross boney pedal deformities bilateral. No pain, crepitus, or limitation noted with foot  and ankle range of motion bilateral. Muscular strength 5/5 in all groups tested bilateral.  Gait: Unassisted, Nonantalgic.   No images are attached to the encounter.  Radiographs:  Deferred Assessment:   1. Ulcer of right foot with fat layer exposed (HCC)   2. Ulcer of left foot with fat layer exposed (HCC)      Plan:  Patient was evaluated and treated and all questions answered.  Ulcer bilateral foot to level subcutaneous fat tissue without evidence of significant infection at this time -Unclear etiology could be due to smoking history however patient does have palpable pedal pulses on bilateral foot that appear strong did not.  He has a significant component of PAD contributing to the ulcers -We discussed the etiology and factors that are a part of the wound healing process.  We also discussed the risk of infection both soft tissue and osteomyelitis from open ulceration.  Discussed the risk of limb loss if this happens or worsens. -Dressed with mupirocin, DSD. -Continue home dressing changes daily with mupirocin ointment and adhesive bandage -Continue off-loading with felt padding. -Vascular testing deferred -HgbA1c: Unknown however patient states he has been tested for diabetes and was negative for abnormality -Last antibiotics: Previously on doxycycline we will hold on further antibiotics at this time -Imaging: Deferred superficial nature of the wounds -Will consider debridement in the future however patient is very painful with any type of debridement may  consider enzymatic debridement with Santyl.  For now I want to see how the wounds improve with mupirocin and Band-Aids as he has been doing as they do appear to be getting better slowly.  Do not recommend he clean the wounds with hydroperoxide instead use saline or wound cleanser spray    Return in about 3 weeks (around 06/21/2023) for Follow-up bilateral foot ulcer.          Corinna Gab, DPM [facility] /  [facility]

## 2023-06-21 ENCOUNTER — Ambulatory Visit (INDEPENDENT_AMBULATORY_CARE_PROVIDER_SITE_OTHER): Payer: Managed Care, Other (non HMO) | Admitting: Podiatry

## 2023-06-21 DIAGNOSIS — Z91199 Patient's noncompliance with other medical treatment and regimen due to unspecified reason: Secondary | ICD-10-CM

## 2023-06-21 NOTE — Progress Notes (Signed)
 Patient absent for apointment

## 2024-04-04 ENCOUNTER — Ambulatory Visit: Admitting: Podiatry

## 2024-04-04 ENCOUNTER — Encounter: Payer: Self-pay | Admitting: Podiatry

## 2024-04-04 DIAGNOSIS — I739 Peripheral vascular disease, unspecified: Secondary | ICD-10-CM | POA: Diagnosis not present

## 2024-04-04 DIAGNOSIS — L97522 Non-pressure chronic ulcer of other part of left foot with fat layer exposed: Secondary | ICD-10-CM | POA: Diagnosis not present

## 2024-04-04 MED ORDER — DOXYCYCLINE HYCLATE 100 MG PO CAPS: 100.0000 mg | ORAL_CAPSULE | Freq: Two times a day (BID) | ORAL | 0 refills | Status: AC

## 2024-04-04 MED ORDER — GENTAMICIN SULFATE 0.1 % EX CREA: 1.0000 | TOPICAL_CREAM | Freq: Two times a day (BID) | CUTANEOUS | 1 refills | Status: AC
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
## 2024-04-09 ENCOUNTER — Ambulatory Visit (HOSPITAL_COMMUNITY)
Admission: RE | Admit: 2024-04-09 | Discharge: 2024-04-09 | Disposition: A | Discharge status: Home / Self Care | Source: Ambulatory Visit | Attending: Surgery | Admitting: Surgery

## 2024-04-09 DIAGNOSIS — L97522 Non-pressure chronic ulcer of other part of left foot with fat layer exposed: Secondary | ICD-10-CM | POA: Insufficient documentation

## 2024-04-09 DIAGNOSIS — I739 Peripheral vascular disease, unspecified: Secondary | ICD-10-CM | POA: Diagnosis present

## 2024-04-09 LAB — VAS US ABI WITH/WO TBI
Left ABI: 1.08
Right ABI: 0.99

## 2024-04-13 ENCOUNTER — Encounter (HOSPITAL_COMMUNITY)

## 2024-04-23 ENCOUNTER — Encounter (HOSPITAL_BASED_OUTPATIENT_CLINIC_OR_DEPARTMENT_OTHER): Attending: Internal Medicine | Admitting: Internal Medicine

## 2024-04-23 DIAGNOSIS — T798XXA Other early complications of trauma, initial encounter: Secondary | ICD-10-CM | POA: Diagnosis not present

## 2024-04-23 DIAGNOSIS — L97522 Non-pressure chronic ulcer of other part of left foot with fat layer exposed: Secondary | ICD-10-CM | POA: Insufficient documentation

## 2024-04-23 DIAGNOSIS — X58XXXA Exposure to other specified factors, initial encounter: Secondary | ICD-10-CM | POA: Diagnosis not present

## 2024-04-26 ENCOUNTER — Encounter (HOSPITAL_BASED_OUTPATIENT_CLINIC_OR_DEPARTMENT_OTHER): Admitting: General Surgery

## 2024-04-30 ENCOUNTER — Encounter (HOSPITAL_BASED_OUTPATIENT_CLINIC_OR_DEPARTMENT_OTHER): Admitting: Internal Medicine

## 2024-04-30 DIAGNOSIS — T798XXA Other early complications of trauma, initial encounter: Secondary | ICD-10-CM | POA: Diagnosis not present
